# Patient Record
Sex: Female | Born: 1980 | Race: White | Hispanic: No | Marital: Married | Smoking: Never smoker
Health system: Southern US, Community
[De-identification: ages and names within clinical notes are randomized; demographics above are authoritative.]

## PROBLEM LIST (undated history)

## (undated) DIAGNOSIS — D329 Benign neoplasm of meninges, unspecified: Secondary | ICD-10-CM

## (undated) HISTORY — PX: HERNIA REPAIR: SHX51

## (undated) HISTORY — DX: Benign neoplasm of meninges, unspecified: D32.9

## (undated) HISTORY — PX: BRAIN MENINGIOMA EXCISION: SHX576

## (undated) HISTORY — PX: WISDOM TOOTH EXTRACTION: SHX21

---
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
## 2006-01-24 ENCOUNTER — Other Ambulatory Visit: Admission: RE | Admit: 2006-01-24 | Discharge: 2006-01-24 | Payer: Self-pay | Admitting: Obstetrics and Gynecology

## 2007-01-28 ENCOUNTER — Other Ambulatory Visit: Admission: RE | Admit: 2007-01-28 | Discharge: 2007-01-28 | Payer: Self-pay | Admitting: Obstetrics and Gynecology

## 2010-05-25 ENCOUNTER — Ambulatory Visit: Payer: Self-pay | Admitting: Endocrinology

## 2020-08-02 DIAGNOSIS — L299 Pruritus, unspecified: Secondary | ICD-10-CM | POA: Diagnosis not present

## 2020-08-02 DIAGNOSIS — Z0000 Encounter for general adult medical examination without abnormal findings: Secondary | ICD-10-CM | POA: Diagnosis not present

## 2020-08-02 DIAGNOSIS — Z6820 Body mass index (BMI) 20.0-20.9, adult: Secondary | ICD-10-CM | POA: Diagnosis not present

## 2020-08-02 DIAGNOSIS — Z1331 Encounter for screening for depression: Secondary | ICD-10-CM | POA: Diagnosis not present

## 2020-08-11 DIAGNOSIS — N63 Unspecified lump in unspecified breast: Secondary | ICD-10-CM | POA: Diagnosis not present

## 2020-08-19 DIAGNOSIS — Z6820 Body mass index (BMI) 20.0-20.9, adult: Secondary | ICD-10-CM | POA: Diagnosis not present

## 2020-08-19 DIAGNOSIS — Z13220 Encounter for screening for lipoid disorders: Secondary | ICD-10-CM | POA: Diagnosis not present

## 2020-08-19 DIAGNOSIS — Z7189 Other specified counseling: Secondary | ICD-10-CM | POA: Diagnosis not present

## 2020-08-19 DIAGNOSIS — B353 Tinea pedis: Secondary | ICD-10-CM | POA: Diagnosis not present

## 2020-08-19 DIAGNOSIS — Z131 Encounter for screening for diabetes mellitus: Secondary | ICD-10-CM | POA: Diagnosis not present

## 2020-11-01 DIAGNOSIS — L814 Other melanin hyperpigmentation: Secondary | ICD-10-CM | POA: Diagnosis not present

## 2020-11-01 DIAGNOSIS — L821 Other seborrheic keratosis: Secondary | ICD-10-CM | POA: Diagnosis not present

## 2021-01-09 DIAGNOSIS — D3161 Benign neoplasm of unspecified site of right orbit: Secondary | ICD-10-CM | POA: Diagnosis not present

## 2021-02-07 ENCOUNTER — Other Ambulatory Visit (HOSPITAL_BASED_OUTPATIENT_CLINIC_OR_DEPARTMENT_OTHER): Payer: Self-pay

## 2021-03-20 ENCOUNTER — Ambulatory Visit: Payer: Self-pay | Admitting: Internal Medicine

## 2021-04-07 ENCOUNTER — Ambulatory Visit: Payer: BC Managed Care – PPO | Admitting: Internal Medicine

## 2021-04-07 ENCOUNTER — Other Ambulatory Visit: Payer: Self-pay

## 2021-04-07 ENCOUNTER — Encounter: Payer: Self-pay | Admitting: Internal Medicine

## 2021-04-07 VITALS — BP 116/74 | HR 66 | Temp 98.3°F | Ht 69.0 in | Wt 140.5 lb

## 2021-04-07 DIAGNOSIS — N393 Stress incontinence (female) (male): Secondary | ICD-10-CM | POA: Insufficient documentation

## 2021-04-07 DIAGNOSIS — Z0001 Encounter for general adult medical examination with abnormal findings: Secondary | ICD-10-CM | POA: Insufficient documentation

## 2021-04-07 DIAGNOSIS — D329 Benign neoplasm of meninges, unspecified: Secondary | ICD-10-CM

## 2021-04-07 DIAGNOSIS — B351 Tinea unguium: Secondary | ICD-10-CM | POA: Insufficient documentation

## 2021-04-07 MED ORDER — CICLOPIROX 8 % EX SOLN: Freq: Every day | CUTANEOUS | 0 refills | Status: DC

## 2021-04-07 NOTE — Assessment & Plan Note (Signed)
Has tried and failed topical otc. Rx ciclopirox topical to use for 3 months then give this 6 months to see if this resolves. Quoted about 30% success rate.  ?

## 2021-04-07 NOTE — Progress Notes (Signed)
? ?  Subjective:  ? ?Patient ID: [name], female    DOB: [birth year], 41 y.o.   MRN: [id number] ? ?HPI ?The patient is a new 41 YO female coming in for ongoing concerns. Also desires physical. ? ?PMH, FMh, social history reviewed and updated ? ?Review of Systems  ?Constitutional: Negative.   ?HENT: Negative.    ?Eyes: Negative.   ?Respiratory:  Negative for cough, chest tightness and shortness of breath.   ?Cardiovascular:  Negative for chest pain, palpitations and leg swelling.  ?Gastrointestinal:  Negative for abdominal distention, abdominal pain, constipation, diarrhea, nausea and vomiting.  ?Genitourinary:  Positive for urgency.  ?     Stress incontinence  ?Musculoskeletal: Negative.   ?Skin: Negative.   ?Neurological: Negative.   ?Psychiatric/Behavioral: Negative.    ? ?Objective:  ?Physical Exam ?Constitutional:   ?   Appearance: She is well-developed.  ?HENT:  ?   Head: Normocephalic and atraumatic.  ?Cardiovascular:  ?   Rate and Rhythm: Normal rate and regular rhythm.  ?Pulmonary:  ?   Effort: Pulmonary effort is normal. No respiratory distress.  ?   Breath sounds: Normal breath sounds. No wheezing or rales.  ?Abdominal:  ?   General: Bowel sounds are normal. There is no distension.  ?   Palpations: Abdomen is soft.  ?   Tenderness: There is no abdominal tenderness. There is no rebound.  ?Musculoskeletal:  ?   Cervical back: Normal range of motion.  ?Skin: ?   General: Skin is warm and dry.  ?Neurological:  ?   Mental Status: She is alert and oriented to person, place, and time.  ?   Coordination: Coordination normal.  ? ? ?Vitals:  ? 04/07/21 0915  ?BP: 116/74  ?Pulse: 66  ?Temp: 98.3 ?F (36.8 ?C)  ?SpO2: 99%  ?Weight: 140 lb 8 oz (63.7 kg)  ?Height: '5\' 9"'$  (1.753 m)  ? ? ?This visit occurred during the SARS-CoV-2 public health emergency.  Safety protocols were in place, including screening questions prior to the visit, additional usage of staff PPE, and extensive cleaning of exam room while observing  appropriate contact time as indicated for disinfecting solutions.  ? ?Assessment & Plan:  ? ?

## 2021-04-07 NOTE — Assessment & Plan Note (Signed)
Overdue for yearly MRI. Had surgical resection 2019 and due yearly MRI for 5 years then biyearly for another 5 years for monitoring for recurrence. We have signed to get her records from prior MRI. Ordered MRI brain to assess.  ?

## 2021-04-07 NOTE — Assessment & Plan Note (Signed)
Flu shot up to date. Covid-19 up to date. Tetanus up to date. Pap smear up to date. Counseled about sun safety and mole surveillance. Counseled about the dangers of distracted driving. Given 10 year screening recommendations.  ? ?

## 2021-04-07 NOTE — Patient Instructions (Addendum)
We have sent in the topical for the feet and will get the MRI. ? ? ? ?

## 2021-04-07 NOTE — Assessment & Plan Note (Signed)
Referral to PT for pelvic floor training. She has tried kegels herself without relief. Using pads to help with leakage.  ?

## 2021-04-24 ENCOUNTER — Ambulatory Visit
Admission: RE | Admit: 2021-04-24 | Discharge: 2021-04-24 | Disposition: A | Discharge status: Home / Self Care | Payer: BC Managed Care – PPO | Source: Ambulatory Visit | Attending: Internal Medicine | Admitting: Internal Medicine

## 2021-04-24 ENCOUNTER — Other Ambulatory Visit: Payer: Self-pay

## 2021-04-24 DIAGNOSIS — Z86011 Personal history of benign neoplasm of the brain: Secondary | ICD-10-CM | POA: Diagnosis not present

## 2021-04-24 DIAGNOSIS — D329 Benign neoplasm of meninges, unspecified: Secondary | ICD-10-CM

## 2021-05-24 ENCOUNTER — Telehealth: Payer: Self-pay

## 2021-05-24 MED ORDER — CICLOPIROX 8 % EX SOLN: Freq: Every day | CUTANEOUS | 0 refills | Status: DC

## 2021-05-24 NOTE — Telephone Encounter (Signed)
Pt is requesting a refill on: ?ciclopirox (PENLAC) 8 % solution ? ?Pharmacy: ?[facility] [place], [place] - [place] ? ?LOV 04/07/21 ?

## 2021-05-24 NOTE — Telephone Encounter (Signed)
Refill has been sent to the pt's pharmacy  

## 2021-06-06 ENCOUNTER — Ambulatory Visit: Payer: BC Managed Care – PPO | Admitting: Internal Medicine

## 2021-06-06 ENCOUNTER — Encounter: Payer: Self-pay | Admitting: Internal Medicine

## 2021-06-06 DIAGNOSIS — J029 Acute pharyngitis, unspecified: Secondary | ICD-10-CM

## 2021-06-06 MED ORDER — AMOXICILLIN-POT CLAVULANATE 875-125 MG PO TABS: 1.0000 | ORAL_TABLET | Freq: Two times a day (BID) | ORAL | 0 refills | Status: AC

## 2021-06-06 NOTE — Patient Instructions (Signed)
I have sent in the augmentin to take 1 pill twice a day for a week if needed. You can either start today or wait 2-3 days and take if not improving. ?

## 2021-06-06 NOTE — Assessment & Plan Note (Signed)
Could be viral however given lack of improvement at 7 days will rx augmentin. We talked about starting in 2-3 days if no improvement. Continue otc cold medications as needed.  ?

## 2021-06-06 NOTE — Progress Notes (Signed)
? ?  Subjective:  ? ?Patient ID: Shelly Mendez, female    DOB: April 17, 1980, 41 y.o.   MRN: [id number] ? ?HPI ?The patient is a 41 YO female coming in for sick visit.  ? ?Review of Systems  ?Constitutional:  Positive for activity change. Negative for appetite change, chills, fatigue, fever and unexpected weight change.  ?HENT:  Positive for congestion, ear pain, sinus pressure and sore throat. Negative for ear discharge, postnasal drip, rhinorrhea, sinus pain, sneezing, tinnitus, trouble swallowing and voice change.   ?Eyes: Negative.   ?Respiratory:  Negative for cough, chest tightness, shortness of breath and wheezing.   ?Cardiovascular: Negative.   ?Gastrointestinal: Negative.   ?Musculoskeletal:  Negative for myalgias.  ?Neurological: Negative.   ? ?Objective:  ?Physical Exam ?Constitutional:   ?   Appearance: She is well-developed.  ?HENT:  ?   Head: Normocephalic and atraumatic.  ?   Comments: Oropharynx with redness, nose with swollen turbinates, TMs normal bilaterally.  ?Neck:  ?   Thyroid: No thyromegaly.  ?Cardiovascular:  ?   Rate and Rhythm: Normal rate and regular rhythm.  ?Pulmonary:  ?   Effort: Pulmonary effort is normal. No respiratory distress.  ?   Breath sounds: Normal breath sounds. No wheezing or rales.  ?Abdominal:  ?   Palpations: Abdomen is soft.  ?Musculoskeletal:     ?   General: Tenderness present.  ?   Cervical back: Normal range of motion.  ?Lymphadenopathy:  ?   Cervical: No cervical adenopathy.  ?Skin: ?   General: Skin is warm and dry.  ?Neurological:  ?   Mental Status: She is alert and oriented to person, place, and time.  ? ? ?Vitals:  ? 06/06/21 0952  ?BP: 120/80  ?Pulse: 68  ?Resp: 18  ?SpO2: 99%  ?Weight: 140 lb (63.5 kg)  ?Height: '5\' 9"'$  (1.753 m)  ? ? ?This visit occurred during the SARS-CoV-2 public health emergency.  Safety protocols were in place, including screening questions prior to the visit, additional usage of staff PPE, and extensive cleaning of exam room while observing  appropriate contact time as indicated for disinfecting solutions.  ? ?Assessment & Plan:  ? ?

## 2021-10-18 DIAGNOSIS — N39498 Other specified urinary incontinence: Secondary | ICD-10-CM | POA: Diagnosis not present

## 2021-10-18 DIAGNOSIS — M62838 Other muscle spasm: Secondary | ICD-10-CM | POA: Diagnosis not present

## 2021-10-18 DIAGNOSIS — R279 Unspecified lack of coordination: Secondary | ICD-10-CM | POA: Diagnosis not present

## 2021-11-02 DIAGNOSIS — R279 Unspecified lack of coordination: Secondary | ICD-10-CM | POA: Diagnosis not present

## 2021-11-02 DIAGNOSIS — M62838 Other muscle spasm: Secondary | ICD-10-CM | POA: Diagnosis not present

## 2021-11-02 DIAGNOSIS — N39498 Other specified urinary incontinence: Secondary | ICD-10-CM | POA: Diagnosis not present

## 2021-11-29 DIAGNOSIS — N39498 Other specified urinary incontinence: Secondary | ICD-10-CM | POA: Diagnosis not present

## 2021-11-29 DIAGNOSIS — M62838 Other muscle spasm: Secondary | ICD-10-CM | POA: Diagnosis not present

## 2021-11-29 DIAGNOSIS — R279 Unspecified lack of coordination: Secondary | ICD-10-CM | POA: Diagnosis not present

## 2021-12-19 ENCOUNTER — Telehealth: Payer: Self-pay | Admitting: Internal Medicine

## 2021-12-19 NOTE — Telephone Encounter (Signed)
Topical medicine for what? Last visit was May 2023. Happy to see in follow up or physical if due

## 2021-12-19 NOTE — Telephone Encounter (Signed)
Pt called to let provider know:  Topical medication is not working. Pt would like oral medication  [place]  ([place])  Ph [phone]

## 2021-12-20 NOTE — Telephone Encounter (Signed)
Scheduled patient for visit to further be advised by Dr Sharlet Salina

## 2021-12-29 ENCOUNTER — Ambulatory Visit: Payer: BC Managed Care – PPO | Admitting: Internal Medicine

## 2021-12-29 ENCOUNTER — Encounter: Payer: Self-pay | Admitting: Internal Medicine

## 2021-12-29 VITALS — BP 118/60 | HR 58 | Temp 97.6°F | Ht 69.0 in | Wt 141.0 lb

## 2021-12-29 DIAGNOSIS — B351 Tinea unguium: Secondary | ICD-10-CM

## 2021-12-29 MED ORDER — CLOTRIMAZOLE-BETAMETHASONE 1-0.05 % EX CREA: 1.0000 | TOPICAL_CREAM | Freq: Every day | CUTANEOUS | 2 refills | Status: DC

## 2021-12-29 NOTE — Patient Instructions (Signed)
We have sent in the cream to use twice a day for several weeks then you can use daily ongoing if needed.

## 2021-12-29 NOTE — Progress Notes (Signed)
   Subjective:   Patient ID: Shelly Mendez, female    DOB: 08-May-1980, 41 y.o.   MRN: [id number]  HPI The patient is a 41 YO female coming in for follow up toenail fungus and athlete's foot.   Review of Systems  Constitutional: Negative.   HENT: Negative.    Eyes: Negative.   Respiratory:  Negative for cough, chest tightness and shortness of breath.   Cardiovascular:  Negative for chest pain, palpitations and leg swelling.  Gastrointestinal:  Negative for abdominal distention, abdominal pain, constipation, diarrhea, nausea and vomiting.  Musculoskeletal: Negative.   Skin: Negative.   Neurological: Negative.   Psychiatric/Behavioral: Negative.      Objective:  Physical Exam Constitutional:      Appearance: She is well-developed.  HENT:     Head: Normocephalic and atraumatic.  Cardiovascular:     Rate and Rhythm: Normal rate and regular rhythm.  Pulmonary:     Effort: Pulmonary effort is normal. No respiratory distress.     Breath sounds: Normal breath sounds. No wheezing or rales.  Abdominal:     General: Bowel sounds are normal. There is no distension.     Palpations: Abdomen is soft.     Tenderness: There is no abdominal tenderness. There is no rebound.  Musculoskeletal:     Cervical back: Normal range of motion.  Skin:    General: Skin is warm and dry.     Comments: 1st toenail improving and appears to be growing out normal, skin with some itching on the 3rd toe  Neurological:     Mental Status: She is alert and oriented to person, place, and time.     Coordination: Coordination normal.     Vitals:   12/29/21 0921  BP: 118/60  Pulse: (!) 58  Temp: 97.6 F (36.4 C)  TempSrc: Oral  SpO2: 99%  Weight: 141 lb (64 kg)  Height: '5\' 9"'$  (1.753 m)    Assessment & Plan:

## 2021-12-29 NOTE — Assessment & Plan Note (Signed)
Toenail on 1st toe improved and appears to be growing out normal. Skin with fungal infection which was not adequately treated. Rx betamethasone/clotrimazole to use BID for 2-4 weeks and then daily prn.

## 2022-01-11 DIAGNOSIS — D3161 Benign neoplasm of unspecified site of right orbit: Secondary | ICD-10-CM | POA: Diagnosis not present

## 2022-02-21 ENCOUNTER — Encounter: Payer: Self-pay | Admitting: Internal Medicine

## 2022-02-21 DIAGNOSIS — D329 Benign neoplasm of meninges, unspecified: Secondary | ICD-10-CM

## 2022-02-26 DIAGNOSIS — D492 Neoplasm of unspecified behavior of bone, soft tissue, and skin: Secondary | ICD-10-CM | POA: Diagnosis not present

## 2022-02-26 DIAGNOSIS — L820 Inflamed seborrheic keratosis: Secondary | ICD-10-CM | POA: Diagnosis not present

## 2022-03-13 ENCOUNTER — Telehealth: Payer: Self-pay | Admitting: Internal Medicine

## 2022-03-13 NOTE — Telephone Encounter (Signed)
Patient called and said her son recently tested positive for the flue and she has symptoms. She would like to know if she can get a prescription for Tamiflu sent to [place], [place] callback number is [phone].

## 2022-03-14 NOTE — Telephone Encounter (Signed)
Contacted patient and left a voicemail for her to return our call.

## 2022-03-14 NOTE — Telephone Encounter (Signed)
Tamiflu can only be started within 48 hours of symptoms so she does not qualify for this. Okay to use over the counter products for cold symptoms.

## 2022-03-14 NOTE — Telephone Encounter (Signed)
LVM for patient stating the advice from Dr crawford informed patient that if nothing changes nor get better from after taking any OTC medication please give our office a call back.

## 2022-05-01 ENCOUNTER — Ambulatory Visit
Admission: RE | Admit: 2022-05-01 | Discharge: 2022-05-01 | Disposition: A | Discharge status: Home / Self Care | Payer: BC Managed Care – PPO | Source: Ambulatory Visit | Attending: Internal Medicine | Admitting: Internal Medicine

## 2022-05-01 DIAGNOSIS — D329 Benign neoplasm of meninges, unspecified: Secondary | ICD-10-CM | POA: Diagnosis not present

## 2022-08-14 DIAGNOSIS — Z124 Encounter for screening for malignant neoplasm of cervix: Secondary | ICD-10-CM | POA: Diagnosis not present

## 2022-08-14 DIAGNOSIS — Z01419 Encounter for gynecological examination (general) (routine) without abnormal findings: Secondary | ICD-10-CM | POA: Diagnosis not present

## 2022-08-14 DIAGNOSIS — Z6820 Body mass index (BMI) 20.0-20.9, adult: Secondary | ICD-10-CM | POA: Diagnosis not present

## 2022-09-29 IMAGING — MR MR HEAD W/O CM
10 series · 48 of 48 positions shown · non-contrast
Comparison: None.

CLINICAL DATA: History of meningioma resection.

EXAM:
MRI HEAD WITHOUT CONTRAST
TECHNIQUE: Multiplanar, multiecho pulse sequences of the brain and surrounding
structures were obtained without intravenous contrast.

[Series 2: T1 · sagittal · 5.0mm · 0.45mm/px · 2 of 19 slices shown]
[im 1/19]
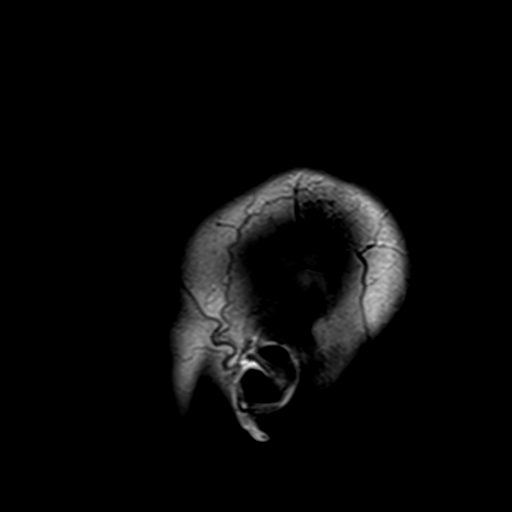
[im 19/19]
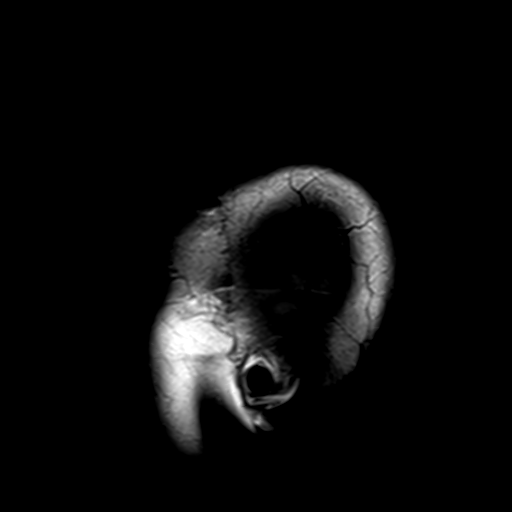

[Series 3: DWI · axial · 3.0mm · 1.80mm/px · z∈[-55,+91]mm · 9 of 99 slices shown (1 of 4)]
[im 1/99]
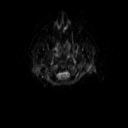
[im 13/99]
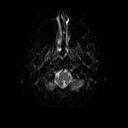
[im 25/99]
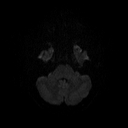
[im 37/99]
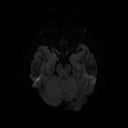
[im 50/99]
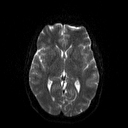
[im 62/99]
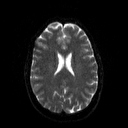
[im 74/99]
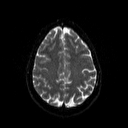
[im 86/99]
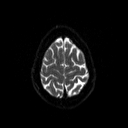
[im 99/99]
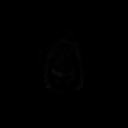

[Series 4: DWI · axial · 3.0mm · 1.80mm/px · z∈[-55,+91]mm · 4 of 50 slices shown (2 of 4)]
[im 1/50]
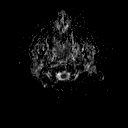
[im 17/50]
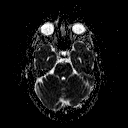
[im 33/50]
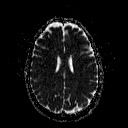
[im 50/50]
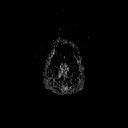

[Series 5: DWI · coronal · 5.0mm · 1.80mm/px · 7 of 76 slices shown (3 of 4)]
[im 1/76]
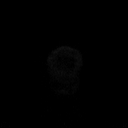
[im 13/76]
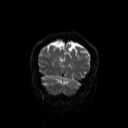
[im 26/76]
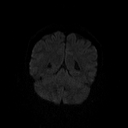
[im 38/76]
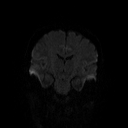
[im 51/76]
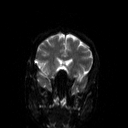
[im 63/76]
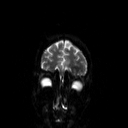
[im 76/76]
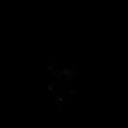

[Series 6: DWI · coronal · 5.0mm · 1.80mm/px · 3 of 37 slices shown (4 of 4)]
[im 1/37]
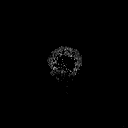
[im 19/37]
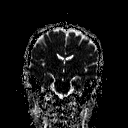
[im 37/37]
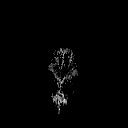

[Series 7: T2 · axial · 5.0mm · 0.60mm/px · z∈[-56,+89]mm · 2 of 22 slices shown (1 of 2)]
[im 1/22]
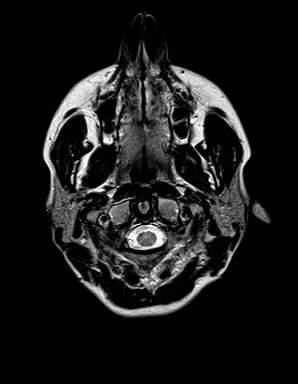
[im 22/22]
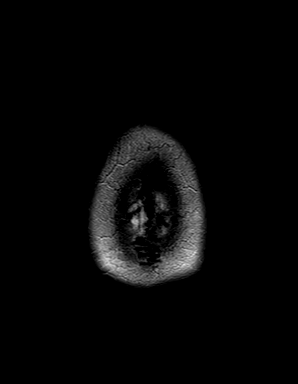

[Series 8: FLAIR · axial · 3.0mm · 0.45mm/px · z∈[-53,+89]mm · 3 of 32 slices shown]
[im 1/32]
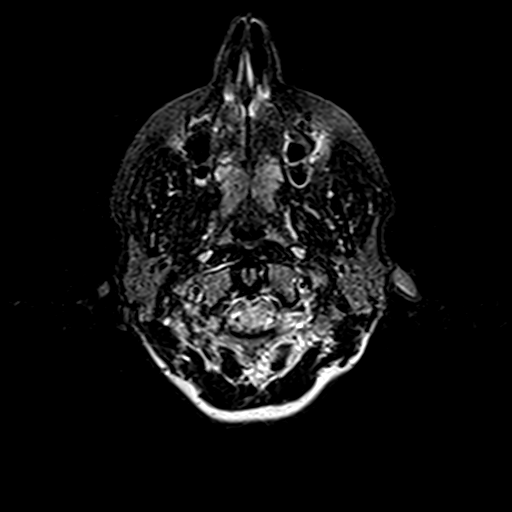
[im 16/32]
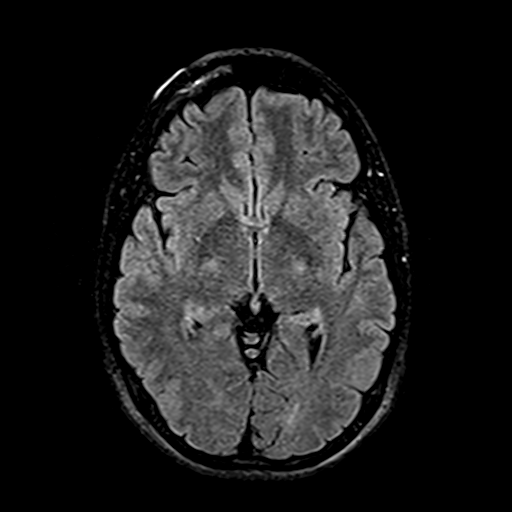
[im 32/32]
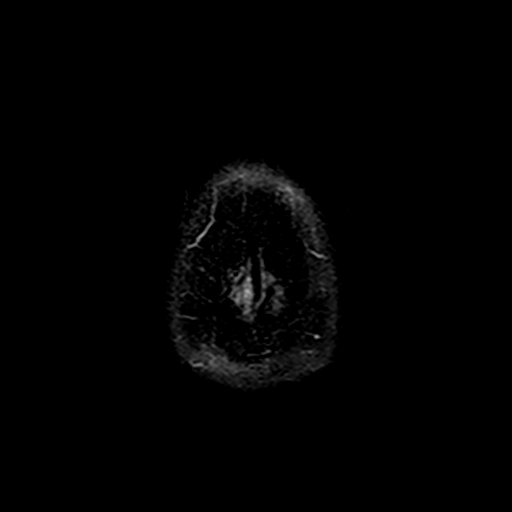

[Series 10: swi_images · axial · 4.0mm · 0.90mm/px · z∈[-52,+87]mm · 3 of 36 slices shown]
[im 1/36]
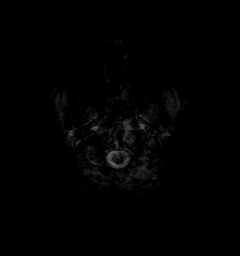
[im 18/36]
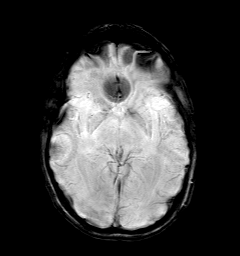
[im 36/36]
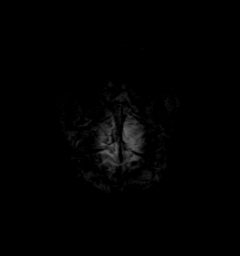

[Series 11: t1_mpr_tra · axial · 1.0mm · 0.71mm/px · z∈[-55,+87]mm · 13 of 144 slices shown]
[im 1/144]
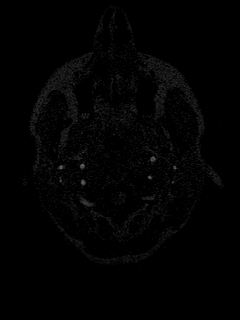
[im 12/144]
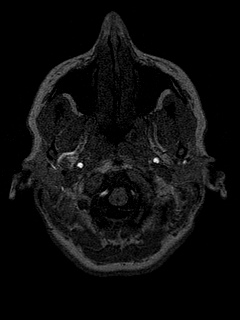
[im 24/144]
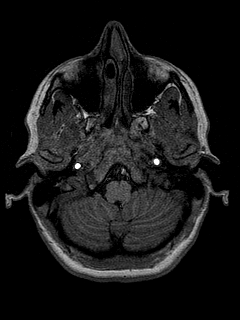
[im 36/144]
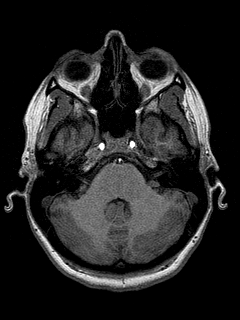
[im 48/144]
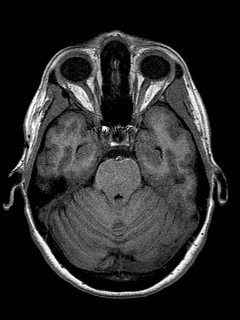
[im 60/144]
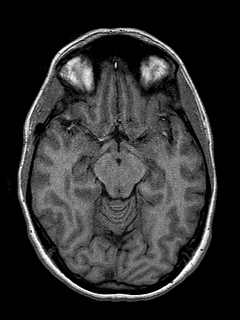
[im 72/144]
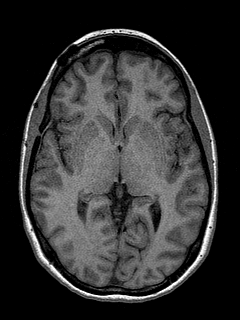
[im 84/144]
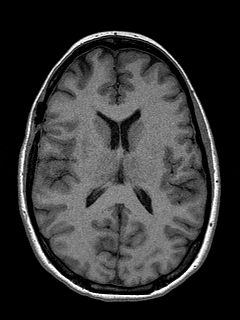
[im 96/144]
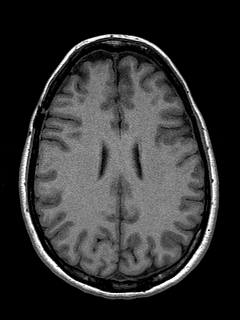
[im 108/144]
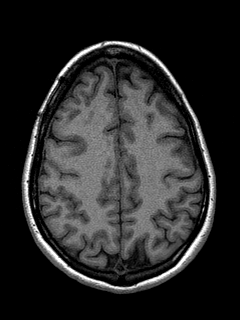
[im 120/144]
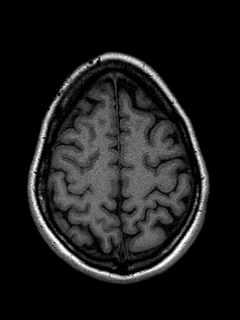
[im 132/144]
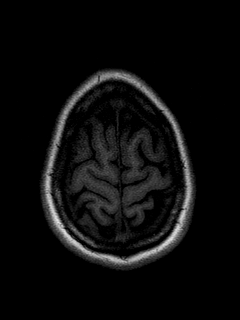
[im 144/144]
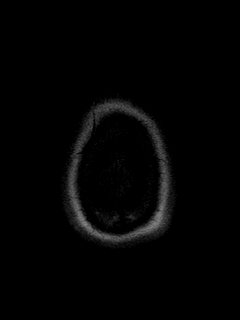

[Series 12: T2 · coronal · 5.0mm · 0.45mm/px · 2 of 27 slices shown (2 of 2)]
[im 1/27]
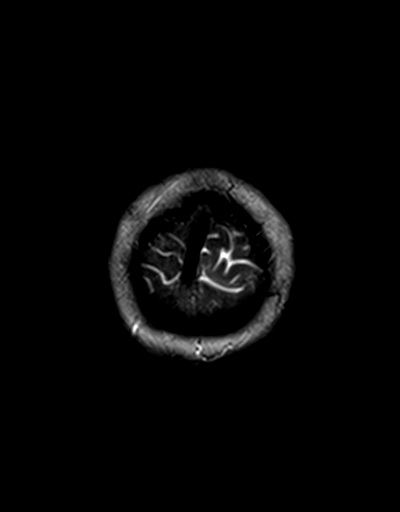
[im 27/27]
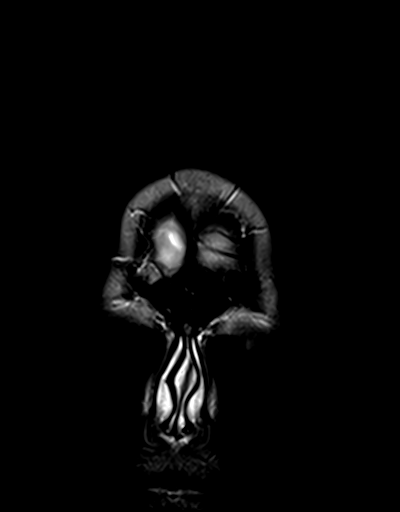

[48 of 48 positions shown; findings below may reference images not displayed]

FINDINGS: Brain: There is no evidence of an acute infarct, intracranial
hemorrhage, midline shift, or extra-axial fluid collection. The
ventricles and sulci are normal. The brain is normal in signal.

There may be mild dural thickening over the right frontal convexity
subjacent to the craniotomy. No mass is identified, however the
absence of IV contrast limits sensitivity for detection of small
residual/recurrent tumor.

Vascular: Major intracranial vascular flow voids are preserved.

Skull and upper cervical spine: Right frontal craniotomy. No
suspicious marrow lesion.

Sinuses/Orbits: Unremarkable orbits. Paranasal sinuses and mastoid
air cells are clear.

Other: None.
IMPRESSION: No recurrent mass identified on this unenhanced study.

## 2022-11-02 DIAGNOSIS — Z1231 Encounter for screening mammogram for malignant neoplasm of breast: Secondary | ICD-10-CM | POA: Diagnosis not present

## 2022-11-07 ENCOUNTER — Other Ambulatory Visit: Payer: Self-pay | Admitting: Obstetrics and Gynecology

## 2022-11-07 DIAGNOSIS — R928 Other abnormal and inconclusive findings on diagnostic imaging of breast: Secondary | ICD-10-CM

## 2022-11-09 ENCOUNTER — Other Ambulatory Visit: Payer: Self-pay | Admitting: Obstetrics and Gynecology

## 2022-11-09 ENCOUNTER — Ambulatory Visit
Admission: RE | Admit: 2022-11-09 | Discharge: 2022-11-09 | Disposition: A | Discharge status: Home / Self Care | Payer: BC Managed Care – PPO | Source: Ambulatory Visit | Attending: Obstetrics and Gynecology | Admitting: Obstetrics and Gynecology

## 2022-11-09 DIAGNOSIS — R921 Mammographic calcification found on diagnostic imaging of breast: Secondary | ICD-10-CM

## 2022-11-09 DIAGNOSIS — R928 Other abnormal and inconclusive findings on diagnostic imaging of breast: Secondary | ICD-10-CM

## 2023-05-20 ENCOUNTER — Ambulatory Visit
Admission: RE | Admit: 2023-05-20 | Discharge: 2023-05-20 | Disposition: A | Discharge status: Home / Self Care | Payer: BC Managed Care – PPO | Source: Ambulatory Visit | Attending: Obstetrics and Gynecology | Admitting: Obstetrics and Gynecology

## 2023-05-20 ENCOUNTER — Other Ambulatory Visit: Payer: Self-pay | Admitting: Obstetrics and Gynecology

## 2023-05-20 DIAGNOSIS — R921 Mammographic calcification found on diagnostic imaging of breast: Secondary | ICD-10-CM

## 2023-10-04 ENCOUNTER — Other Ambulatory Visit (HOSPITAL_COMMUNITY): Payer: Self-pay | Admitting: Obstetrics and Gynecology

## 2023-10-04 DIAGNOSIS — R011 Cardiac murmur, unspecified: Secondary | ICD-10-CM

## 2023-10-18 ENCOUNTER — Encounter (HOSPITAL_COMMUNITY): Payer: Self-pay

## 2023-10-18 ENCOUNTER — Ambulatory Visit (HOSPITAL_COMMUNITY): Admission: RE | Admit: 2023-10-18 | Payer: Self-pay | Source: Ambulatory Visit

## 2023-11-20 ENCOUNTER — Ambulatory Visit
Admission: RE | Admit: 2023-11-20 | Discharge: 2023-11-20 | Disposition: A | Discharge status: Home / Self Care | Source: Ambulatory Visit | Attending: Obstetrics and Gynecology | Admitting: Obstetrics and Gynecology

## 2023-11-20 DIAGNOSIS — R921 Mammographic calcification found on diagnostic imaging of breast: Secondary | ICD-10-CM

## 2023-11-29 ENCOUNTER — Ambulatory Visit: Admitting: Obstetrics

## 2024-01-17 ENCOUNTER — Telehealth: Payer: Self-pay | Admitting: Internal Medicine

## 2024-02-20 ENCOUNTER — Ambulatory Visit: Admitting: Family Medicine

## 2024-02-20 ENCOUNTER — Encounter: Payer: Self-pay | Admitting: Family Medicine

## 2024-02-20 VITALS — BP 122/74 | HR 76 | Temp 97.7°F | Ht 69.0 in | Wt 146.5 lb

## 2024-02-20 DIAGNOSIS — B353 Tinea pedis: Secondary | ICD-10-CM | POA: Diagnosis not present

## 2024-02-20 DIAGNOSIS — D329 Benign neoplasm of meninges, unspecified: Secondary | ICD-10-CM

## 2024-02-20 MED ORDER — BETAMETHASONE DIPROPIONATE 0.05 % EX CREA: TOPICAL_CREAM | Freq: Two times a day (BID) | CUTANEOUS | 0 refills | Status: AC

## 2024-02-20 MED ORDER — KETOCONAZOLE 2 % EX CREA: 1.0000 | TOPICAL_CREAM | Freq: Two times a day (BID) | CUTANEOUS | 0 refills | Status: AC

## 2024-02-20 NOTE — Patient Instructions (Addendum)
 Welcome to [facility]! It was a pleasure meeting you today.  As discussed, Please schedule a 1 month follow up visit today.  [phone] other studies   PLEASE NOTE:  If you had any LAB tests please let us  know if you have not heard back within a few days. You may see your results on MyChart before we have a chance to review them but we will give you a call once they are reviewed by us . If we ordered any REFERRALS today, please let us  know if you have not heard from their office within the next week.  Let us  know through MyChart if you are needing REFILLS, or have your pharmacy send us  the request. You can also use MyChart to communicate with me or any office staff.  Please try these tips to maintain a healthy lifestyle:  Eat most of your calories during the day when you are active. Eliminate processed foods including packaged sweets (pies, cakes, cookies), reduce intake of potatoes, white bread, white pasta, and white rice. Look for whole grain options, oat flour or almond flour.  Each meal should contain half fruits/vegetables, one quarter protein, and one quarter carbs (no bigger than a computer mouse).  Cut down on sweet beverages. This includes juice, soda, and sweet tea. Also watch fruit intake, though this is a healthier sweet option, it still contains natural sugar! Limit to 3 servings daily.  Drink at least 1 glass of water with each meal and aim for at least 8 glasses per day  Exercise at least 150 minutes every week.

## 2024-02-20 NOTE — Progress Notes (Signed)
 "  Subjective:     Patient ID: Shelly Mendez, female    DOB: [birth year], 44 y.o.   MRN: [id number]  Chief Complaint  Patient presents with   Transitions Of Care    Pt is transferring care    Discussed the use of AI scribe software for clinical note transcription with the patient, who gave verbal consent to proceed.  History of Present Illness Shelly Mendez is a 44 year old female toc with a history of right frontal meningioma who presents for a follow-up MRI.  She has been having regular MRIs following removal of her right frontal meningioma in December 2019. Initially, she had yearly MRIs for five years, and she was told that after five years, MRIs would be done every two years. She missed her MRI last year but is due for one this year, marking the fifth year since her surgery. She has not experienced new symptoms such as headaches, dizziness, or vision changes since the meningioma removal. Prior to the surgery, she had vision loss in her right eye and headaches due to the tumor's location near the optic nerve. Post-surgery, her right eye vision cannot be fully corrected with glasses, but the impairment is minor and not noticeable in daily life. She prefers open MRI due to claustrophobia.  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 She uses a cortisone-containing cream nightly, which helps manage the condition, but it has not resolved. Previous testing confirmed it as a fungal infection.  She has a history of a heart murmur identified by a previous provider, but she has not pursued echo due to cost concerns($1000). No dizziness, lightheadedness, chest pain, or shortness of breath, and no limitations with exercise.  She has a Mirena IUD for birth control, inserted in August 2025. She consumes alcohol occasionally, does not smoke or vape, and is married with two children. She is self-employed as a veterinary surgeon.  She has a  history of poison ivy exposure, currently affecting her abdomen, arms, and neck, which has persisted for about two and a half weeks. She prefers topical treatment over oral steroids.  No headaches, dizziness, vision changes, chest pain, shortness of breath, muscle aches, joint pains, depression, or suicidal thoughts. Reports leaking urine and is scheduled to see a urogynecologist.    Health Maintenance Due  Topic Date Due   HIV Screening  Never done   Hepatitis C Screening  Never done   Hepatitis B Vaccines 19-59 Average Risk (1 of 3 - 19+ 3-dose series) Never done   Cervical Cancer Screening (HPV/Pap Cotest)  Never done    Past Medical History:  Diagnosis Date   Meningioma (HCC)    R frontal.  s/p removal    Past Surgical History:  Procedure Laterality Date   BRAIN MENINGIOMA EXCISION     R frontal-2019 dec   HERNIA REPAIR     ing at 26 mo age   66 TOOTH EXTRACTION      Current Medications[1]  Allergies[2] ROS neg/noncontributory except as noted HPI/below      Objective:     BP 122/74 (BP Location: Left Arm, Patient Position: Sitting, Cuff Size: Normal)   Pulse 76   Temp 97.7 F (36.5 C) (Temporal)   Ht 5' 9 (1.753 m)   Wt 146 lb 8 oz (66.5 kg)   SpO2 98%   BMI 21.63 kg/m  Wt Readings from Last 3 Encounters:  02/20/24  146 lb 8 oz (66.5 kg)  12/29/21 141 lb (64 kg)  06/06/21 140 lb (63.5 kg)    Physical Exam GENERAL: Well developed, well nourished, no acute distress. HEAD EYES EARS NOSE THROAT: Normocephalic, atraumatic, conjunctiva not injected, sclera nonicteric. CARDIAC: Regular rate and rhythm, S1 S2 present, very mild heart murmur, dorsalis pedis 2 plus bilaterally. NECK: Supple, no thyromegaly, no nodes, no carotid bruits. LUNGS: Clear to auscultation bilaterally, no wheezes. ABDOMEN: Bowel sounds present, soft, non-tender, non-distended, no hepatosplenomegaly, no masses. EXTREMITIES: No edema. MUSCULOSKELETAL: Moves all four extremities, no  gross abnormalities. NEUROLOGICAL: Alert and oriented x3, cranial nerves II through XII intact. PSYCHIATRIC: Normal mood, good eye contact. SKIN: Poison ivy on abdomen, both arms, neck.       Assessment & Plan:  Tinea pedis of left foot -     Ambulatory referral to Podiatry  Meningioma [facility]) -     MR BRAIN W WO CONTRAST; Future  Other orders -     Ketoconazole ; Apply 1 Application topically 2 (two) times daily.  Dispense: 60 g; Refill: 0 -     Betamethasone  Dipropionate; Apply topically 2 (two) times daily.  Dispense: 30 g; Refill: 0    Assessment and Plan Assessment & Plan Meningioma, status post resection   She underwent resection of a right frontal meningioma in December 2019. Currently, she has no symptoms like headache, dizziness, or vision changes. Previously, she experienced vision changes in the right eye and headaches due to optic nerve pressure. Post-surgery, there is no residual weakness or significant vision impairment. MRI surveillance is due as part of the follow-up protocol. An open MRI with IV contrast has been ordered for meningioma surveillance. She is instructed to contact [facility] to confirm insurance coverage and schedule the MRI.  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 Previous cortisone cream treatment was ineffective. Ketoconazole  cream is prescribed, and she is referred to podiatry for further evaluation and management.  Allergic contact dermatitis due to poison ivy   She has acute allergic contact dermatitis from poison ivy exposure, affecting the abdomen, both arms, and neck, persisting for about two weeks. A steroid cream is prescribed for treatment.  Heart murmur   A heart murmur was detected during a previous examination. She has no symptoms such as dizziness, lightheadedness, chest pain, or shortness of breath. The murmur is subtle and may be benign, so  further testing is deferred due to the lack of symptoms and cost considerations. Monitoring will continue with a follow-up physical examination in three months.     Return for annual physical-next avail.  Jenkins CHRISTELLA Carrel, MD     [1]  Current Outpatient Medications:    betamethasone  dipropionate 0.05 % cream, Apply topically 2 (two) times daily., Disp: 30 g, Rfl: 0   ketoconazole  (NIZORAL ) 2 % cream, Apply 1 Application topically 2 (two) times daily., Disp: 60 g, Rfl: 0   levonorgestrel (MIRENA) 20 MCG/DAY IUD, 1 each by Intrauterine route once., Disp: , Rfl:  [2]  Allergies Allergen Reactions   Codeine Other (See Comments)    Unable to take as a kid   Oxycontin [Oxycodone Hcl] Other (See Comments)    vomiting   "

## 2024-02-24 ENCOUNTER — Encounter: Admitting: Family Medicine

## 2024-03-10 ENCOUNTER — Ambulatory Visit: Admitting: Podiatry

## 2024-03-21 ENCOUNTER — Other Ambulatory Visit

## 2024-04-02 ENCOUNTER — Encounter: Admitting: Family Medicine
